# Patient Record
Sex: Female | Born: 1990 | Hispanic: No | Marital: Single | Smoking: Former smoker
Health system: Southern US, Community
[De-identification: ages and names within clinical notes are randomized; demographics above are authoritative.]

## PROBLEM LIST (undated history)

## (undated) DIAGNOSIS — Z3490 Encounter for supervision of normal pregnancy, unspecified, unspecified trimester: Secondary | ICD-10-CM

## (undated) HISTORY — PX: DILATION AND CURETTAGE OF UTERUS: SHX78

---

## 2013-11-10 ENCOUNTER — Encounter (HOSPITAL_COMMUNITY): Payer: Self-pay | Admitting: Emergency Medicine

## 2013-11-10 ENCOUNTER — Emergency Department (HOSPITAL_COMMUNITY)
Admission: EM | Admit: 2013-11-10 | Discharge: 2013-11-10 | Disposition: A | Discharge status: Home / Self Care | Payer: 59 | Attending: Emergency Medicine | Admitting: Emergency Medicine

## 2013-11-10 ENCOUNTER — Emergency Department (HOSPITAL_COMMUNITY): Payer: 59

## 2013-11-10 DIAGNOSIS — O200 Threatened abortion: Secondary | ICD-10-CM

## 2013-11-10 DIAGNOSIS — O039 Complete or unspecified spontaneous abortion without complication: Secondary | ICD-10-CM | POA: Insufficient documentation

## 2013-11-10 DIAGNOSIS — Z87891 Personal history of nicotine dependence: Secondary | ICD-10-CM | POA: Insufficient documentation

## 2013-11-10 HISTORY — DX: Encounter for supervision of normal pregnancy, unspecified, unspecified trimester: Z34.90

## 2013-11-10 LAB — BASIC METABOLIC PANEL
BUN: 13 mg/dL (ref 6–23)
CALCIUM: 9.4 mg/dL (ref 8.4–10.5)
CO2: 20 mEq/L (ref 19–32)
Chloride: 103 mEq/L (ref 96–112)
Creatinine, Ser: 0.67 mg/dL (ref 0.50–1.10)
GFR calc non Af Amer: >90 mL/min (ref >90)
Glucose, Bld: 75 mg/dL (ref 70–99)
Potassium: 4 mEq/L (ref 3.7–5.3)
Sodium: 135 mEq/L — ABNORMAL LOW (ref 137–147)

## 2013-11-10 LAB — CBC WITH DIFFERENTIAL/PLATELET
BASOS ABS: 0 10*3/uL (ref 0.0–0.1)
Basophils Relative: 0 % (ref 0–1)
EOS ABS: 0 10*3/uL (ref 0.0–0.7)
EOS PCT: 0 % (ref 0–5)
HCT: 37 % (ref 36.0–46.0)
Hemoglobin: 13.3 g/dL (ref 12.0–15.0)
Lymphocytes Relative: 14 % (ref 12–46)
Lymphs Abs: 1.6 10*3/uL (ref 0.7–4.0)
MCH: 31.9 pg (ref 26.0–34.0)
MCHC: 35.9 g/dL (ref 30.0–36.0)
MCV: 88.7 fL (ref 78.0–100.0)
MONO ABS: 0.6 10*3/uL (ref 0.1–1.0)
MONOS PCT: 6 % (ref 3–12)
Neutro Abs: 9.1 10*3/uL — ABNORMAL HIGH (ref 1.7–7.7)
Neutrophils Relative %: 80 % — ABNORMAL HIGH (ref 43–77)
Platelets: 226 10*3/uL (ref 150–400)
RBC: 4.17 MIL/uL (ref 3.87–5.11)
RDW: 12.3 % (ref 11.5–15.5)
WBC: 11.3 10*3/uL — ABNORMAL HIGH (ref 4.0–10.5)

## 2013-11-10 LAB — URINE MICROSCOPIC-ADD ON

## 2013-11-10 LAB — URINALYSIS, ROUTINE W REFLEX MICROSCOPIC
Bilirubin Urine: NEGATIVE
GLUCOSE, UA: NEGATIVE mg/dL
Ketones, ur: >80 mg/dL — AB
LEUKOCYTES UA: NEGATIVE
NITRITE: NEGATIVE
PROTEIN: 30 mg/dL — AB
Specific Gravity, Urine: 1.03 (ref 1.005–1.030)
Urobilinogen, UA: 0.2 mg/dL (ref 0.0–1.0)
pH: 6 (ref 5.0–8.0)

## 2013-11-10 LAB — HCG, QUANTITATIVE, PREGNANCY: HCG, BETA CHAIN, QUANT, S: 9697 m[IU]/mL — AB (ref <5)

## 2013-11-10 LAB — HIV ANTIBODY (ROUTINE TESTING W REFLEX): HIV: NONREACTIVE

## 2013-11-10 LAB — WET PREP, GENITAL
CLUE CELLS WET PREP: NONE SEEN
Trich, Wet Prep: NONE SEEN
Yeast Wet Prep HPF POC: NONE SEEN

## 2013-11-10 NOTE — ED Notes (Signed)
Pt. Stated, Im [redacted] weeks pregnant and I've been bleeding since Monday or Tuesday and her Dr. Catalina Pizzaold her to come here.  Some abd. Cramping and just feeling bad.  Ive used 4 pads yesterday then it slowed down at night.

## 2013-11-10 NOTE — ED Notes (Signed)
Patient transported to Ultrasound 

## 2013-11-10 NOTE — Discharge Instructions (Signed)
Threatened Miscarriage  Bleeding during the first 20 weeks of pregnancy is common. This is sometimes called a threatened miscarriage. This is a pregnancy that is threatening to end before the twentieth week of pregnancy. Often this bleeding stops with bed rest or decreased activities as suggested by your caregiver and the pregnancy continues without any more problems. You may be asked to not have sexual intercourse, have orgasms or use tampons until further notice. Sometimes a threatened miscarriage can progress to a complete or incomplete miscarriage. This may or may not require further treatment. Some miscarriages occur before a woman misses a menstrual period and knows she is pregnant.  Miscarriages occur in 15 to 20% of all pregnancies and usually occur during the first 13 weeks of the pregnancy. The exact cause of a miscarriage is usually never known. A miscarriage is natures way of ending a pregnancy that is abnormal or would not make it to term. There are some things that may put you at risk to have a miscarriage, such as:  · Hormone problems.  · Infection of the uterus or cervix.  · Chronic illness, diabetes for example, especially if it is not controlled.  · Abnormal shaped uterus.  · Fibroids in the uterus.  · Incompetent cervix (the cervix is too weak to hold the baby).  · Smoking.  · Drinking too much alcohol. It's best not to drink any alcohol when you are pregnant.  · Taking illegal drugs.  TREATMENT   When a miscarriage becomes complete and all products of conception (all the tissue in the uterus) have been passed, often no treatment is needed. If you think you passed tissue, save it in a container and take it to your doctor for evaluation. If the miscarriage is incomplete (parts of the fetus or placenta remain in the uterus), further treatment may be needed. The most common reason for further treatment is continued bleeding (hemorrhage) because pregnancy tissue did not pass out of the uterus. This  often occurs if a miscarriage is incomplete. Tissue left behind may also become infected. Treatment usually is dilatation and curettage (the removal of the remaining products of pregnancy. This can be done by a simple sucking procedure (suction curettage) or a simple scraping of the inside of the uterus. This may be done in the hospital or in the caregiver's office. This is only done when your caregiver knows that there is no chance for the pregnancy to proceed to term. This is determined by physical examination, negative pregnancy test, falling pregnancy hormone count and/or, an ultrasound revealing a dead fetus.  Miscarriages are often a very emotional time for prospective mothers and fathers. This is not you or your partners fault. It did not occur because of an inadequacy in you or your partner. Nearly all miscarriages occur because the pregnancy has started off wrongly. At least half of these pregnancies have a chromosomal abnormality. It is almost always not inherited. Others may have developmental problems with the fetus or placenta. This does not always show up even when the products miscarried are studied under the microscope. The miscarriage is nearly always not your fault and it is not likely that you could have prevented it from happening. If you are having emotional and grieving problems, talk to your health care provider and even seek counseling, if necessary, before getting pregnant again. You can begin trying for another pregnancy as soon as your caregiver says it is OK.  HOME CARE INSTRUCTIONS   · Your caregiver may order   bed rest depending on how much bleeding and cramping you are having. You may be limited to only getting up to go to the bathroom. You may be allowed to continue light activity. You may need to make arrangements for the care of your other children and for any other responsibilities.  · Keep track of the number of pads you use each day, how often you have to change pads and how  saturated (soaked) they are. Record this information.  · DO NOT USE TAMPONS. Do not douche, have sexual intercourse or orgasms until approved by your caregiver.  · You may receive a follow up appointment for re-evaluation of your pregnancy and a repeat blood test. Re-evaluation often occurs after 2 days and again in 4 to 6 weeks. It is very important that you follow-up in the recommended time period.  · If you are Rh negative and the father is Rh positive or you do not know the fathers' blood type, you may receive a shot (Rh immune globulin) to help prevent abnormal antibodies that can develop and affect the baby in any future pregnancies.  SEEK IMMEDIATE MEDICAL CARE IF:  · You have severe cramps in your stomach, back, or abdomen.  · You have a sudden onset of severe pain in the lower part of your abdomen.  · You develop chills.  · You run an unexplained temperature of 101° F (38.3° C) or higher.  · You pass large clots or tissue. Save any tissue for your caregiver to inspect.  · Your bleeding increases or you become light-headed, weak, or have fainting episodes.  · You have a gush of fluid from your vagina.  · You pass out. This could mean you have a tubal (ectopic) pregnancy.  Document Released: 07/05/2005 Document Revised: 09/27/2011 Document Reviewed: 02/19/2008  ExitCare® Patient Information ©2014 [employer].

## 2013-11-10 NOTE — ED Provider Notes (Signed)
CSN: [id number]     Arrival date & time 11/10/13  1354 History   First MD Initiated Contact with Patient 11/10/13 1505     Chief Complaint  Patient presents with  . Vaginal Bleeding  . Routine Prenatal Visit     (Consider location/radiation/quality/duration/timing/severity/associated sxs/prior Treatment) Patient is a 23 y.o. female presenting with vaginal bleeding.  Vaginal Bleeding  Pt is G2P1A0 at approx [redacted]wks gestation by LMP with pos home pregnancy test confirmed by urine hcg at Ob/Gyn office 1 week ago. She reports she has had about 4-5 days of vaginal bleeding, initially very light spotting but has been increasing since then. No clots or tissue. Has had occasional lower abdominal cramping. Advised by Ob/Gyn to come to the ED for eval. Has not had US for this pregnancy yet.   Past Medical History  Diagnosis Date  . Pregnant    History reviewed. No pertinent past surgical history. No family history on file. History  Substance Use Topics  . Smoking status: Former Games developermoker  . Smokeless tobacco: Not on file  . Alcohol Use: No   OB History   Grav Para Term Preterm Abortions TAB SAB Ect Mult Living   1              Review of Systems  Genitourinary: Positive for vaginal bleeding.    All other systems reviewed and are negative except as noted in HPI.     Allergies  Review of patient's allergies indicates no known allergies.  Home Medications   Prior to Admission medications   Medication Sig Start Date End Date Taking? Authorizing Provider  Prenatal Multivit-Min-Fe-FA (PRENATAL VITAMINS PO) Take 1 tablet by mouth daily.   Yes Historical Provider, MD   BP 122/82  Pulse 94  Temp(Src) 98.5 F (36.9 C) (Oral)  Resp 16  Wt 111 lb 4 oz (50.463 kg)  SpO2 100%  LMP 09/08/2013 Physical Exam  Nursing note and vitals reviewed. Constitutional: She is oriented to person, place, and time. She appears well-developed and well-nourished.  HENT:  Head: Normocephalic and  atraumatic.  Eyes: EOM are normal. Pupils are equal, round, and reactive to light.  Neck: Normal range of motion. Neck supple.  Cardiovascular: Normal rate, normal heart sounds and intact distal pulses.   Pulmonary/Chest: Effort normal and breath sounds normal.  Abdominal: Bowel sounds are normal. She exhibits no distension. There is no tenderness.  Genitourinary:  Vaginal bleeding, moderate; no POC, os is closed, no adnexal mass or tenderness  Musculoskeletal: Normal range of motion. She exhibits no edema and no tenderness.  Neurological: She is alert and oriented to person, place, and time. She has normal strength. No cranial nerve deficit or sensory deficit.  Skin: Skin is warm and dry. No rash noted.  Psychiatric: She has a normal mood and affect.    ED Course  Procedures (including critical care time) Labs Review Labs Reviewed  WET PREP, GENITAL - Abnormal; Notable for the following:    WBC, Wet Prep HPF POC FEW (*)    All other components within normal limits  CBC WITH DIFFERENTIAL - Abnormal; Notable for the following:    WBC 11.3 (*)    Neutrophils Relative % 80 (*)    Neutro Abs 9.1 (*)    All other components within normal limits  BASIC METABOLIC PANEL - Abnormal; Notable for the following:    Sodium 135 (*)    All other components within normal limits  HCG, QUANTITATIVE, PREGNANCY - Abnormal; Notable for the  following:    hCG, Beta Chain, Quant, S [id number] (*)    All other components within normal limits  GC/CHLAMYDIA PROBE AMP  URINALYSIS, ROUTINE W REFLEX MICROSCOPIC  HIV ANTIBODY (ROUTINE TESTING)    Imaging Review [place] Ob Comp Less 14 Wks  11/10/2013   CLINICAL DATA:  Vaginal bleeding. Estimated gestational age by last menstrual period equals 9 weeks 0 days  EXAM: OBSTETRIC <14 WK ULTRASOUND  TECHNIQUE: Transabdominal ultrasound was performed for evaluation of the gestation as well as the maternal uterus and adnexal regions.  COMPARISON:  None.  FINDINGS: Intrauterine  gestational sac: Single  Yolk sac:  Present  Embryo:  Present  Cardiac Activity: Present  Heart Rate: 136 bpm  MSD:   mm    w     d  CRL:   3.1  mm   5 w 6 d                  US EDC: 07/07/2014  Maternal uterus/adnexae: There is a moderate to large subchorionic hemorrhage measuring 5.6 x 1.9 x 3.5 cm. The ovaries are normal. No free fluid.  IMPRESSION: 1. Single intrauterine gestation with embryo and normal cardiac activity.  2. Estimated gestational age by crown rump length equals 5 weeks 6 days.  3.  Moderate to large subchorionic hemorrhage.   Electronically Signed   By: Genevive BiStewart  Edmunds M.D.   On: 11/10/2013 16:45   [place] Ob Transvaginal  11/10/2013   : Diagnostic report text CLINICAL DATA: Vaginal bleeding. Estimated gestational age by last menstrual period equals 9 weeks 0 days  EXAM: OBSTETRIC <14 WK ULTRASOUND  TECHNIQUE: Transabdominal ultrasound was performed for evaluation of the gestation as well as the maternal uterus and adnexal regions.  COMPARISON: None.  FINDINGS: Intrauterine gestational sac: Single  Yolk sac: Present  Embryo: Present  Cardiac Activity: Present  Heart Rate: 136 bpm  MSD: mm w d  CRL: 3.1 mm 5 w 6 d US EDC: 07/07/2014  Maternal uterus/adnexae: There is a moderate to large subchorionic hemorrhage measuring 5.6 x 1.9 x 3.5 cm. The ovaries are normal. No free fluid.  IMPRESSION: 1. Single intrauterine gestation with embryo and normal cardiac activity.  2. Estimated gestational age by crown rump length equals 5 weeks 6 days.  3. Moderate to large subchorionic hemorrhage.   Electronically Signed   By: Genevive BiStewart  Edmunds M.D.   On: 11/10/2013 16:57     EKG Interpretation None      MDM   Final diagnoses:  Threatened miscarriage    US as above shows live IUP but large subchorionic hemorrhage. Advised patient of these findings and possibility of miscarriage. Advised 48hr followup with Ob for recheck of hcg.     Charles B. Bernette MayersSheldon, MD 11/10/13 1745

## 2013-11-10 NOTE — ED Notes (Signed)
Pt [facility] [place] on 11/02/13 with a positive pregnancy test.  Pt states she was started on prenatal vitamins and estimated at 5 weeks at that time.  Pt reports having severe abdominal cramps starting this past Monday or Tuesday with heavy bleeding.  She has had intermitted heavy bleeding and spotting since with multiple small blood clots.  Pt was told by the on-call MD at OBGYN to come to the ED for evaluation and a blood hormone level check and a recheck in a few days to see if her hormones are increasing or decreasing.  Pt in NAD, A&O.

## 2013-11-12 ENCOUNTER — Emergency Department (HOSPITAL_COMMUNITY)
Admission: EM | Admit: 2013-11-12 | Discharge: 2013-11-12 | Discharge status: Absent without leave | Payer: 59 | Source: Home / Self Care

## 2013-11-12 LAB — GC/CHLAMYDIA PROBE AMP
CT Probe RNA: NEGATIVE
GC Probe RNA: NEGATIVE

## 2013-11-13 ENCOUNTER — Encounter (HOSPITAL_COMMUNITY): Payer: Self-pay | Admitting: Emergency Medicine

## 2013-11-13 ENCOUNTER — Emergency Department (HOSPITAL_COMMUNITY)
Admission: EM | Admit: 2013-11-13 | Discharge: 2013-11-13 | Disposition: A | Discharge status: Home / Self Care | Payer: 59 | Source: Home / Self Care | Attending: Family Medicine | Admitting: Family Medicine

## 2013-11-13 DIAGNOSIS — Z331 Pregnant state, incidental: Secondary | ICD-10-CM

## 2013-11-13 DIAGNOSIS — Z3490 Encounter for supervision of normal pregnancy, unspecified, unspecified trimester: Secondary | ICD-10-CM

## 2013-11-13 DIAGNOSIS — N898 Other specified noninflammatory disorders of vagina: Secondary | ICD-10-CM

## 2013-11-13 LAB — HCG, QUANTITATIVE, PREGNANCY: hCG, Beta Chain, Quant, S: 12450 m[IU]/mL — ABNORMAL HIGH (ref <5)

## 2013-11-13 NOTE — ED Notes (Signed)
Reports currently pregnant and was seen in ER for bleeding.  Pt was told to have blood work drawn to check HCG levels.  Pt voices no other concerns at this time.

## 2013-11-13 NOTE — Discharge Instructions (Signed)
Vaginal Bleeding During Pregnancy, First Trimester °A small amount of bleeding (spotting) from the vagina is relatively common in early pregnancy. It usually stops on its own. Various things may cause bleeding or spotting in early pregnancy. Some bleeding may be related to the pregnancy, and some may not. In most cases, the bleeding is normal and is not a problem. However, bleeding can also be a sign of something serious. Be sure to tell your health care provider about any vaginal bleeding right away. °Some possible causes of vaginal bleeding during the first trimester include: °· Infection or inflammation of the cervix. °· Growths (polyps) on the cervix. °· Miscarriage or threatened miscarriage. °· Pregnancy tissue has developed outside of the uterus and in a fallopian tube (tubal pregnancy). °· Tiny cysts have developed in the uterus instead of pregnancy tissue (molar pregnancy). °HOME CARE INSTRUCTIONS  °Watch your condition for any changes. The following actions may help to lessen any discomfort you are feeling: °· Follow your health care provider's instructions for limiting your activity. If your health care provider orders bed rest, you may need to stay in bed and only get up to use the bathroom. However, your health care provider may allow you to continue light activity. °· If needed, make plans for someone to help with your regular activities and responsibilities while you are on bed rest. °· Keep track of the number of pads you use each day, how often you change pads, and how soaked (saturated) they are. Write this down. °· Do not use tampons. Do not douche. °· Do not have sexual intercourse or orgasms until approved by your health care provider. °· If you pass any tissue from your vagina, save the tissue so you can show it to your health care provider. °· Only take over-the-counter or prescription medicines as directed by your health care provider. °· Do not take aspirin because it can make you  bleed. °· Keep all follow-up appointments as directed by your health care provider. °SEEK MEDICAL CARE IF: °· You have any vaginal bleeding during any part of your pregnancy. °· You have cramps or labor pains. °SEEK IMMEDIATE MEDICAL CARE IF:  °· You have severe cramps in your back or belly (abdomen). °· You have a fever, not controlled by medicine. °· You pass large clots or tissue from your vagina. °· Your bleeding increases. °· You feel lightheaded or weak, or you have fainting episodes. °· You have chills. °· You are leaking fluid or have a gush of fluid from your vagina. °· You pass out while having a bowel movement. °MAKE SURE YOU: °· Understand these instructions. °· Will watch your condition. °· Will get help right away if you are not doing well or get worse. °Document Released: 04/14/2005 Document Revised: 04/25/2013 Document Reviewed: 03/12/2013 °ExitCare® Patient Information ©2014 [employer]. ° °

## 2013-11-13 NOTE — ED Provider Notes (Signed)
CSN: [id number]     Arrival date & time 11/13/13  [id number] History   First MD Initiated Contact with Patient 11/13/13 [phone]     Chief Complaint  Patient presents with  . Follow-up    needs HCG levels drawn.   (Consider location/radiation/quality/duration/timing/severity/associated sxs/prior Treatment) Patient is a 23 y.o. female presenting with vaginal bleeding. The history is provided by the patient. No language interpreter was used.  Vaginal Bleeding Quality:  Bright red Severity:  Mild Timing:  Constant Chronicity:  New Possible pregnancy: no   Relieved by:  Nothing Worsened by:  Nothing tried Ineffective treatments:  None tried [facility] and was told to come here for repeat hcg.    Past Medical History  Diagnosis Date  . Pregnant    Past Surgical History  Procedure Laterality Date  . Dilation and curettage of uterus     No family history on file. History  Substance Use Topics  . Smoking status: Former Games developermoker  . Smokeless tobacco: Not on file  . Alcohol Use: No   OB History   Grav Para Term Preterm Abortions TAB SAB Ect Mult Living   1              Review of Systems  Genitourinary: Positive for vaginal bleeding.  All other systems reviewed and are negative.   Allergies  Review of patient's allergies indicates no known allergies.  Home Medications   Prior to Admission medications   Medication Sig Start Date End Date Taking? Authorizing Provider  Prenatal Multivit-Min-Fe-FA (PRENATAL VITAMINS PO) Take 1 tablet by mouth daily.   Yes Historical Provider, MD   BP 129/70  Pulse 102  Temp(Src) 99.2 F (37.3 C) (Oral)  Resp 12  SpO2 100%  LMP 09/08/2013 Physical Exam  Nursing note and vitals reviewed. Constitutional: She appears well-developed and well-nourished.  Eyes: Pupils are equal, round, and reactive to light.  Neck: Normal range of motion.  Cardiovascular: Normal rate.   Pulmonary/Chest: Effort normal.  Musculoskeletal:  Normal range of motion.  Neurological: She is alert.  Skin: Skin is warm.  Psychiatric: She has a normal mood and affect.    ED Course  Procedures (including critical care time) Labs Review Labs Reviewed  HCG, QUANTITATIVE, PREGNANCY    Imaging Review No results found.   MDM   1. Pregnancy     Pt's bhcg has increased to 12450 from 9697.   Pt reports she needs to establish ob/gyn care   Elson AreasLeslie K Nery Kalisz, PA-C 11/13/13 1231

## 2013-11-14 NOTE — ED Provider Notes (Signed)
Medical screening examination/treatment/procedure(s) were performed by resident physician or non-physician practitioner and as supervising physician I was immediately available for consultation/collaboration.   KINDL,JAMES DOUGLAS MD.   James D Kindl, MD 11/14/13 1659 

## 2013-11-15 NOTE — ED Notes (Signed)
BHCG 12,450 H.  Message sent to NCR CorporationKaren Sofia PA. 11/15/2013

## 2013-11-16 ENCOUNTER — Telehealth (HOSPITAL_COMMUNITY): Payer: Self-pay | Admitting: *Deleted

## 2013-11-16 NOTE — ED Notes (Signed)
Michelle MaskerKaren Sofia PA wrote to notify pt. [facility].  I called pt. Pt. verified x 2 and given results and PA's instructions. Desiree LucySuzanne M Wilhelm Ganaway 11/16/2013

## 2014-05-20 ENCOUNTER — Encounter (HOSPITAL_COMMUNITY): Payer: Self-pay | Admitting: Emergency Medicine

## 2015-02-05 IMAGING — US US OB COMP LESS 14 WK
1 series · 14 of 28 positions shown · non-contrast
Comparison: None.

CLINICAL DATA: Vaginal bleeding. Estimated gestational age by last
menstrual period equals 9 weeks 0 days

EXAM:
OBSTETRIC <14 WK ULTRASOUND
TECHNIQUE: Transabdominal ultrasound was performed for evaluation of the
gestation as well as the maternal uterus and adnexal regions.

[Series 1: us ob comp less 14 wk · 0.18mm/px · 55 acquisitions, 14 frames shown]
[im 3/55]
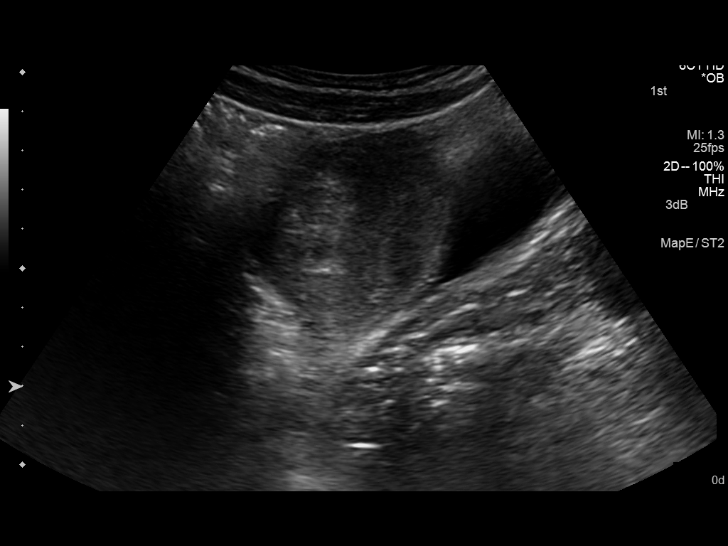
[im 7/55]
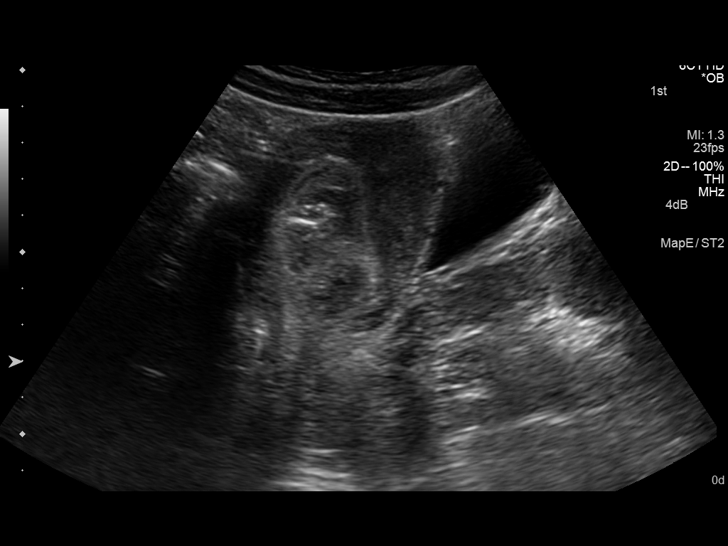
[im 11/55]
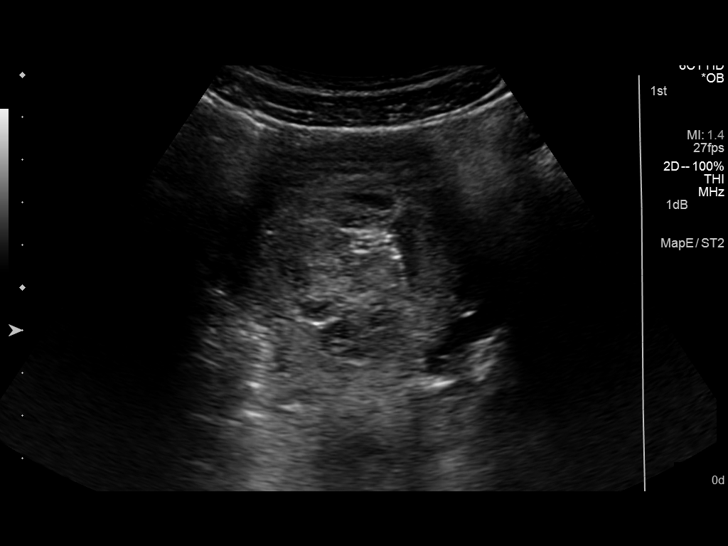
[im 15/55]
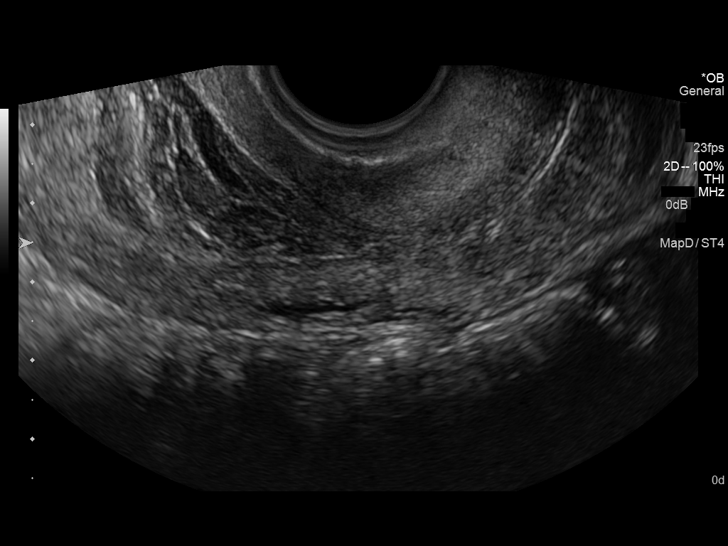
[im 19/55]
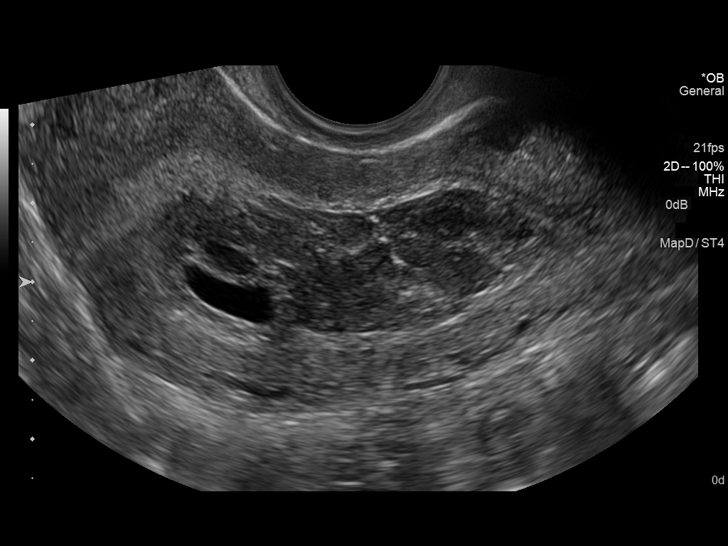
[im 23/55]
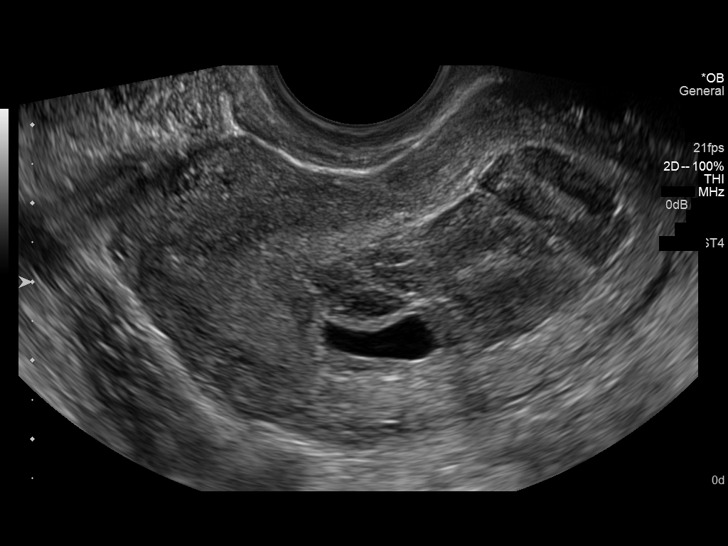
[im 27/55]
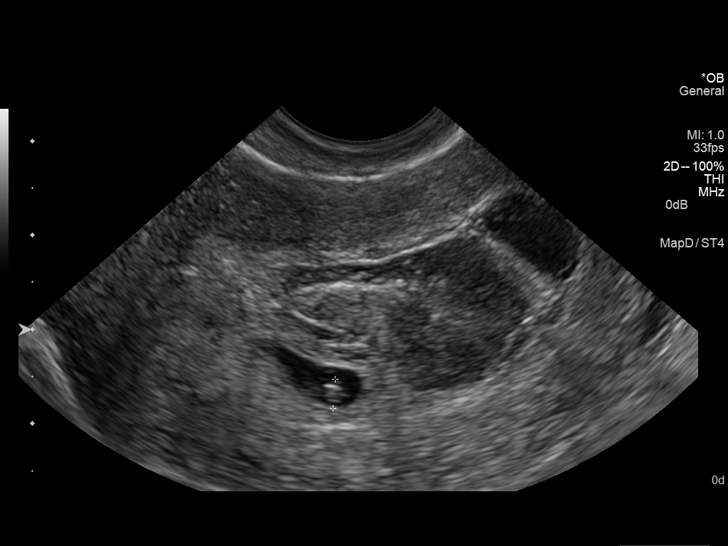
[im 31/55]
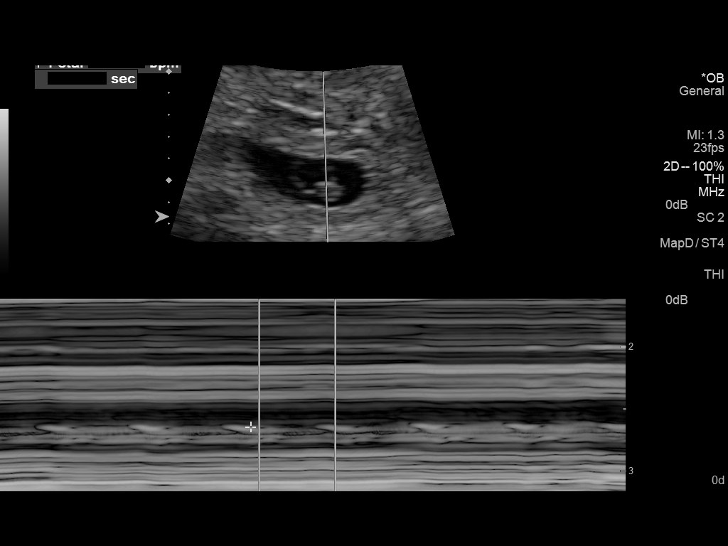
[im 35/55]
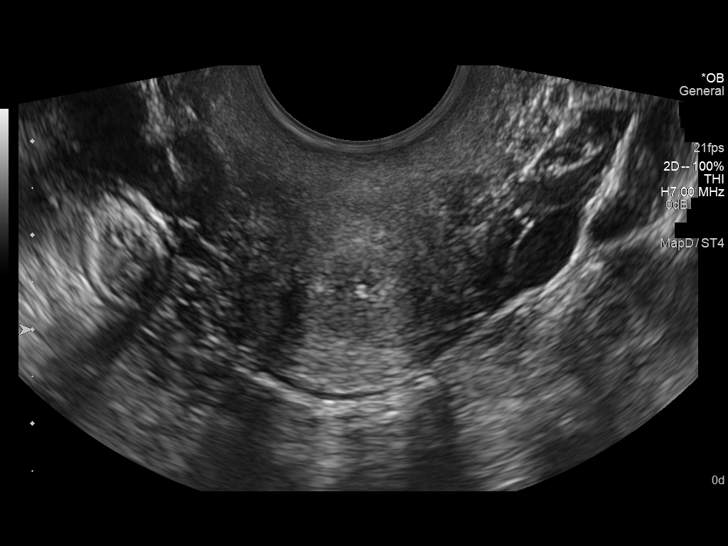
[im 39/55]
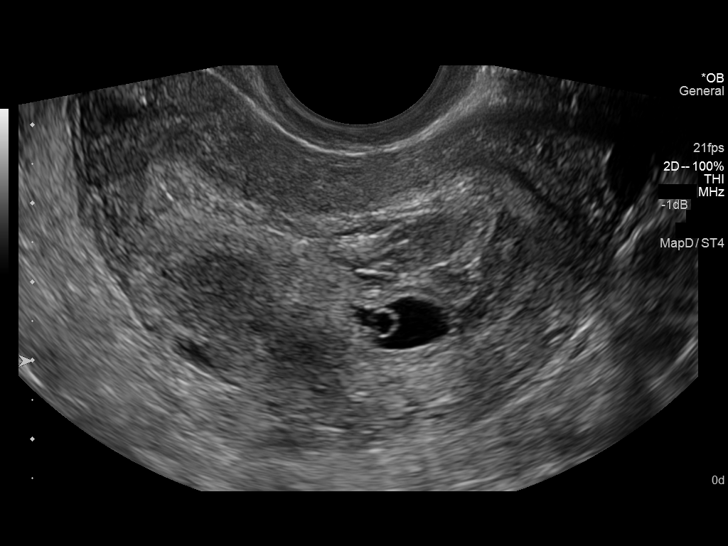
[im 43/55]
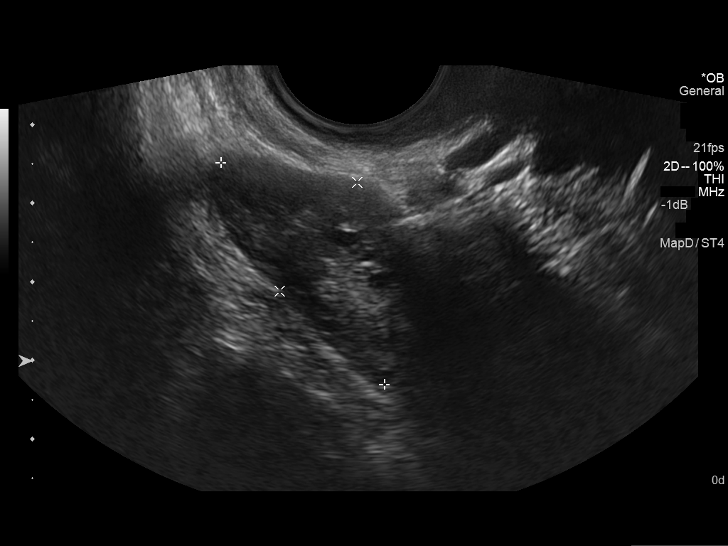
[im 47/55]
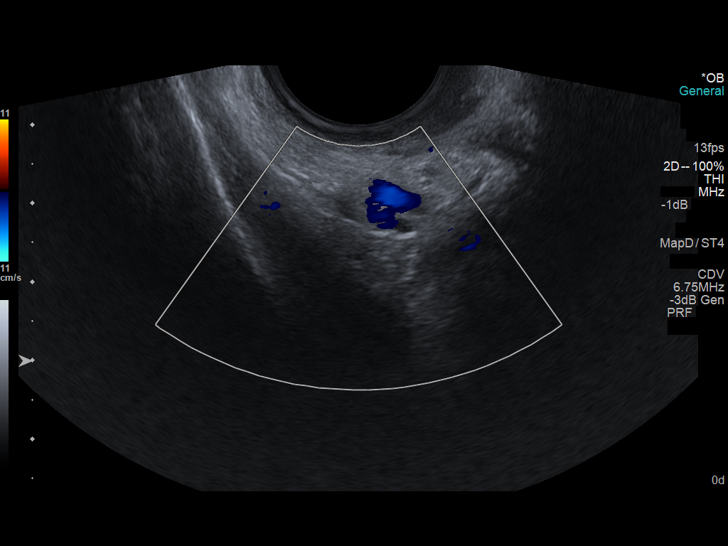
[im 51/55]
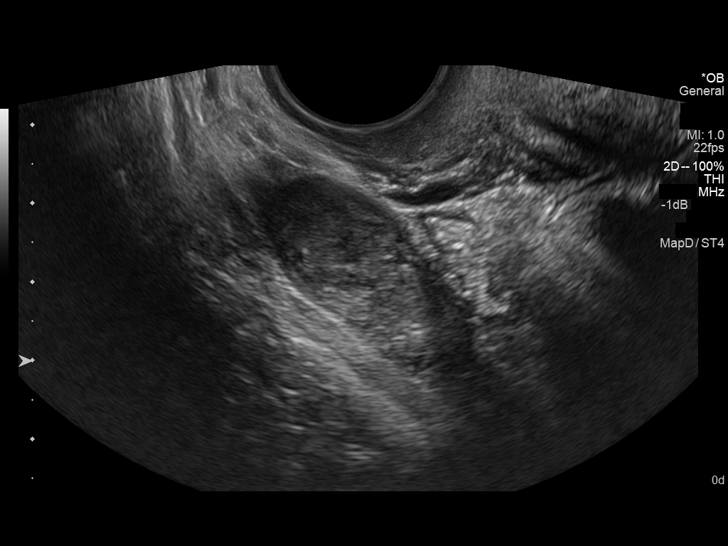
[im 55/55]
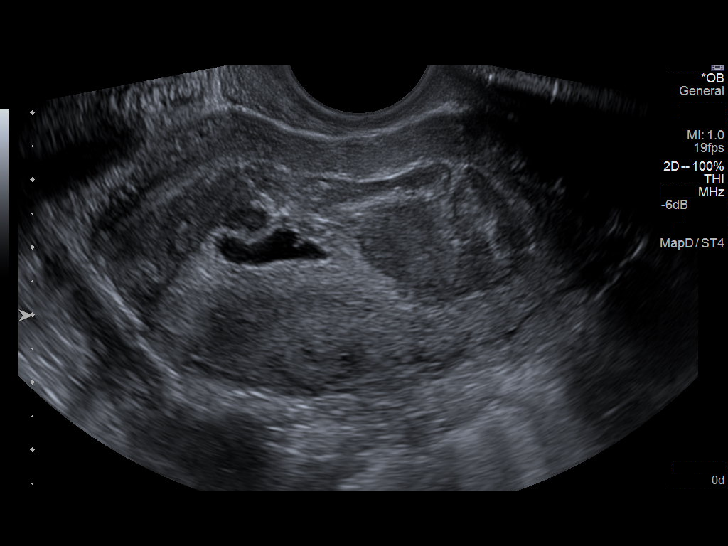

[14 of 28 positions shown; findings below may reference images not displayed]

FINDINGS: Intrauterine gestational sac: Single

Yolk sac:  Present

Embryo:  Present

Cardiac Activity: Present

Heart Rate: 136 bpm

MSD:   mm    w     d

CRL:   3.1  mm   5 w 6 d                  US EDC: 07/07/2014

Maternal uterus/adnexae: There is a moderate to large subchorionic
hemorrhage measuring 5.6 x 1.9 x 3.5 cm. The ovaries are normal. No
free fluid.
IMPRESSION: 1. Single intrauterine gestation with embryo and normal cardiac
activity.

2. Estimated gestational age by crown rump length equals 5 weeks 6
days.

3.  Moderate to large subchorionic hemorrhage.

## 2017-04-08 ENCOUNTER — Encounter: Payer: Self-pay | Admitting: Podiatry

## 2017-04-08 ENCOUNTER — Ambulatory Visit (INDEPENDENT_AMBULATORY_CARE_PROVIDER_SITE_OTHER): Payer: BLUE CROSS/BLUE SHIELD | Admitting: Podiatry

## 2017-04-08 ENCOUNTER — Ambulatory Visit (INDEPENDENT_AMBULATORY_CARE_PROVIDER_SITE_OTHER): Payer: BLUE CROSS/BLUE SHIELD

## 2017-04-08 VITALS — BP 109/75 | HR 90 | Resp 18

## 2017-04-08 DIAGNOSIS — M779 Enthesopathy, unspecified: Secondary | ICD-10-CM | POA: Diagnosis not present

## 2017-04-08 DIAGNOSIS — M2010 Hallux valgus (acquired), unspecified foot: Secondary | ICD-10-CM | POA: Diagnosis not present

## 2017-04-08 DIAGNOSIS — M659 Synovitis and tenosynovitis, unspecified: Secondary | ICD-10-CM | POA: Diagnosis not present

## 2017-04-08 NOTE — Patient Instructions (Signed)

## 2017-04-12 NOTE — Progress Notes (Signed)
Subjective:    Patient ID: Michelle Duke, female   DOB: 26 y.o.   MRN: [id number]   HPI 26 year old female presents the office they for concerns of left foot pain which started in March 2018.She points on the first MTPJ where she has majority of her pain. She has noticed some swelling to the area but she denies any numbness or tingling. She states the areas painful pressure in shoes and she just under toe. She has tried changing her shoes as well as heat, cold, Tylenol which was not effective. She has no other concerns today.   Review of Systems  All other systems reviewed and are negative.       Objective:  Physical Exam General: AAO x3, NAD  Dermatological: Skin is warm, dry and supple bilateral. Nails x 10 are well manicured; remaining integument appears unremarkable at this time. There are no open sores, no preulcerative lesions, no rash or signs of infection present.  Vascular: Dorsalis Pedis artery and Posterior Tibial artery pedal pulses are 2/4 bilateral with immedate capillary fill time. Pedal hair growth present. There is no pain with calf compression, swelling, warmth, erythema.   Neruologic: Grossly intact via light touch bilateral.  Protective threshold with Semmes Wienstein monofilament intact to all pedal sites bilateral.  Musculoskeletal: There is tenderness left foot mostly along the first MTPJ both medially as well as dorsally where she has majority of symptoms. There is mild pain with MPJ range of motion but there is no significant crepitation or restriction of range of motion. Mild bunions present. There is no first ray hypermobility present. There is no other area pinpoint bony tenderness is no pain the vibratory sensation.  Gait: Unassisted, Nonantalgic.      Assessment:     Left first MTPJ capsulitis    Plan:     -Treatment options discussed including all alternatives, risks, and complications -Etiology of symptoms were discussed -X-rays were obtained and  reviewed with the patient. HAV is present. There is no definitive evidence of acute fracture or stress fracture. Old possible avulsion fracture the fifth toe however this is currently asymptomatic. -We discussed shoe gear medications, offloading padding. She has also wanted to go ahead and proceed with a steroid injection into the area today. I discussed the risks of doing this and she wishes to go ahead and proceed stay. Under sterile conditions 5 mg of Kenalog as well as lidocaine plain was infiltrated into and around the first MPJ with any complications. Post injection care was discussed. -Ice -RTC in 2 weeks or sooner if needed.  Ovid Curd, DPM

## 2017-04-13 ENCOUNTER — Telehealth: Payer: Self-pay | Admitting: *Deleted

## 2017-04-13 NOTE — Telephone Encounter (Signed)
Called patient and left a message for the patient for her to call me back today. Misty Stanley
